# Patient Record
Sex: Female | Born: 1975 | Hispanic: No | Smoking: Current every day smoker
Health system: Southern US, Community
[De-identification: ages and names within clinical notes are randomized; demographics above are authoritative.]

## PROBLEM LIST (undated history)

## (undated) DIAGNOSIS — J302 Other seasonal allergic rhinitis: Secondary | ICD-10-CM

## (undated) DIAGNOSIS — S6291XA Unspecified fracture of right wrist and hand, initial encounter for closed fracture: Secondary | ICD-10-CM

## (undated) DIAGNOSIS — F419 Anxiety disorder, unspecified: Secondary | ICD-10-CM

## (undated) DIAGNOSIS — F32A Depression, unspecified: Secondary | ICD-10-CM

## (undated) DIAGNOSIS — Z973 Presence of spectacles and contact lenses: Secondary | ICD-10-CM

## (undated) DIAGNOSIS — K219 Gastro-esophageal reflux disease without esophagitis: Secondary | ICD-10-CM

## (undated) DIAGNOSIS — F329 Major depressive disorder, single episode, unspecified: Secondary | ICD-10-CM

---

## 1998-11-29 ENCOUNTER — Other Ambulatory Visit: Admission: RE | Admit: 1998-11-29 | Discharge: 1998-11-29 | Payer: Self-pay | Admitting: Obstetrics and Gynecology

## 2004-02-27 ENCOUNTER — Ambulatory Visit (HOSPITAL_COMMUNITY): Admission: RE | Admit: 2004-02-27 | Discharge: 2004-02-27 | Payer: Self-pay | Admitting: Obstetrics and Gynecology

## 2004-03-14 ENCOUNTER — Other Ambulatory Visit: Admission: RE | Admit: 2004-03-14 | Discharge: 2004-03-14 | Payer: Self-pay | Admitting: Obstetrics and Gynecology

## 2004-10-08 ENCOUNTER — Inpatient Hospital Stay (HOSPITAL_COMMUNITY): Admission: AD | Admit: 2004-10-08 | Discharge: 2004-10-10 | Payer: Self-pay | Admitting: Obstetrics and Gynecology

## 2005-08-07 IMAGING — US US OB COMP LESS 14 WK
1 series · 14 of 27 positions shown · non-contrast
Comparison: none

CLINICAL DATA: Irregular cycles.  Evaluate dating.

 OBSTETRICAL ULTRASOUND WITH TRANSVAGINAL:
 Number of Fetuses: 1
 Heart Rate:  172
 Amniotic fluid:  Normal
 CRL:    2.36 cm  9 w 0 d
 Fetal anatomy could not be evaluated due to the early gestational age.  Yolk sac and amnion visualized
 MATERNAL FINDINGS
 Cervix not evaluated.  Ovaries normal.

[Series 1: unknown · 0.28mm/px · 14 of 27 slices shown]
[im 1/27]
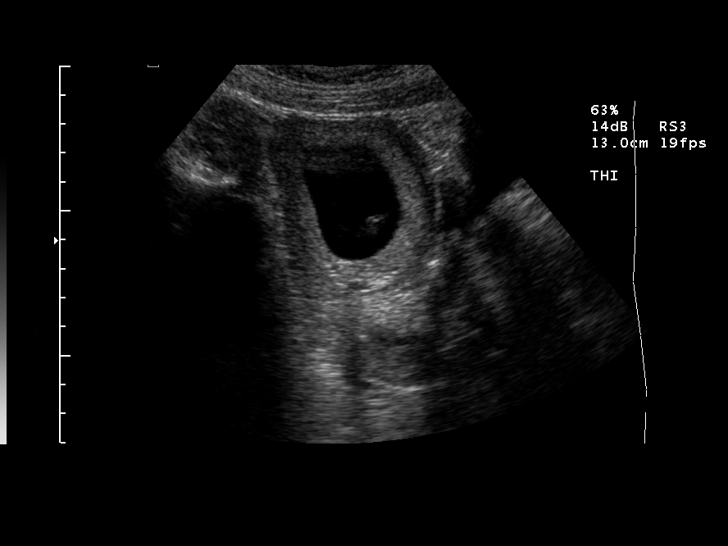
[im 3/27]
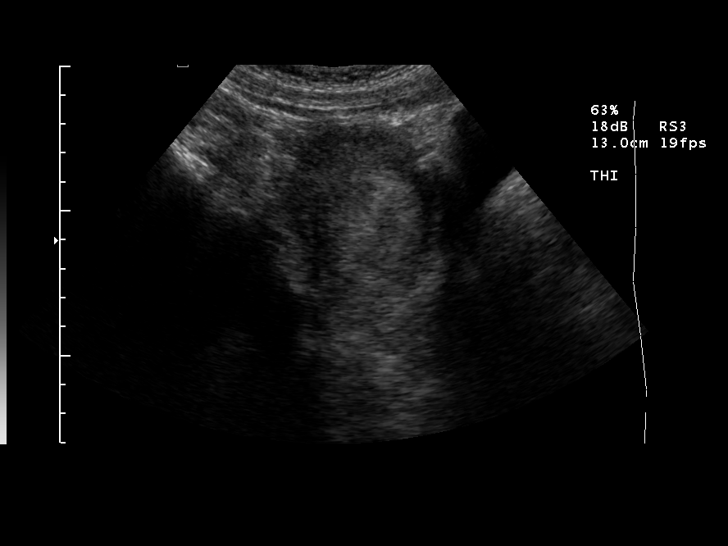
[im 5/27]
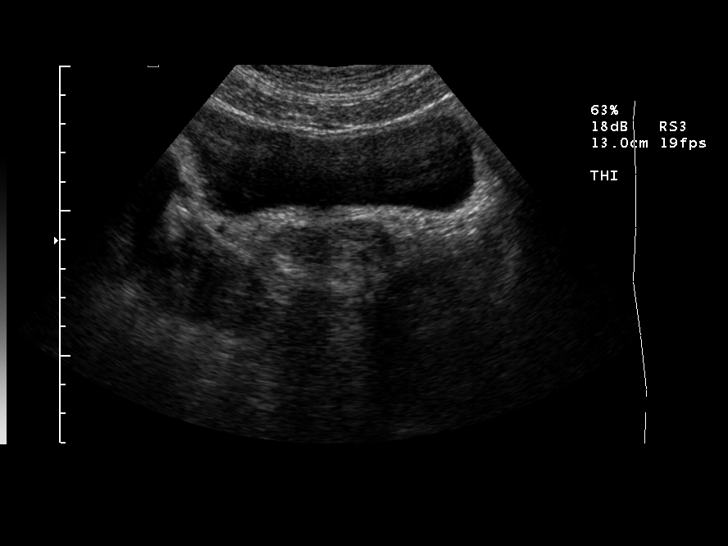
[im 7/27]
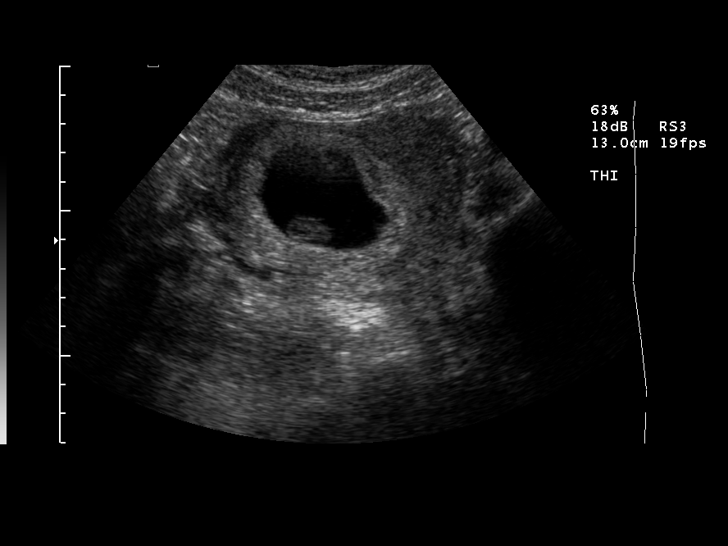
[im 9/27]
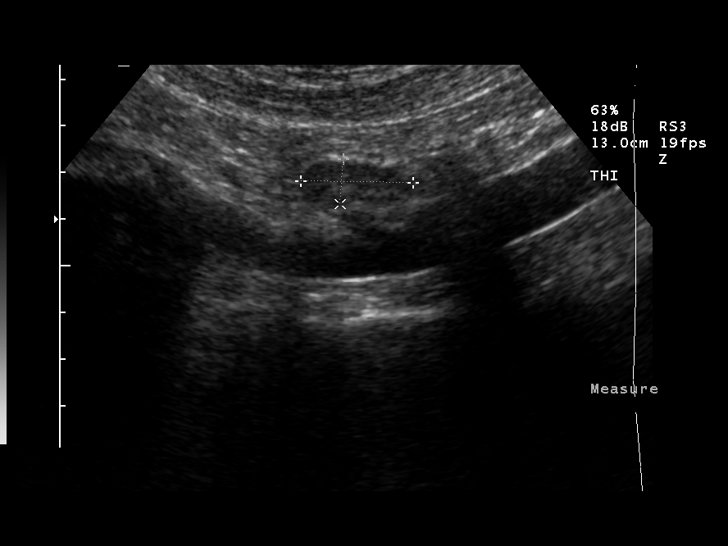
[im 11/27]
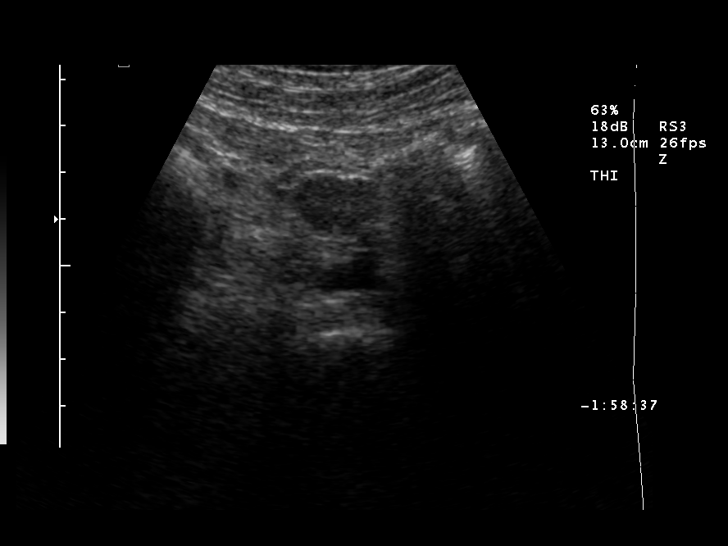
[im 13/27]
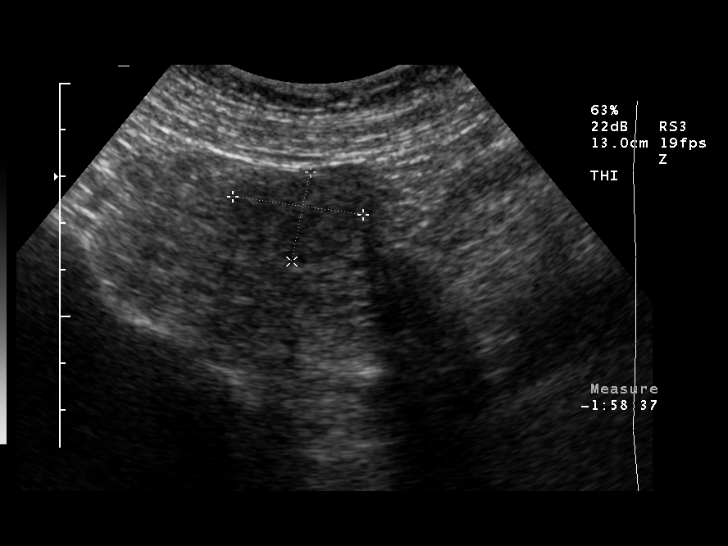
[im 15/27]
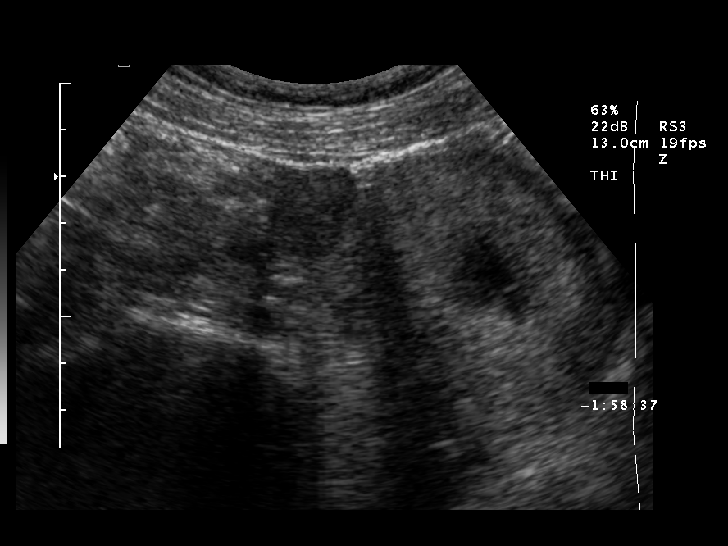
[im 17/27]
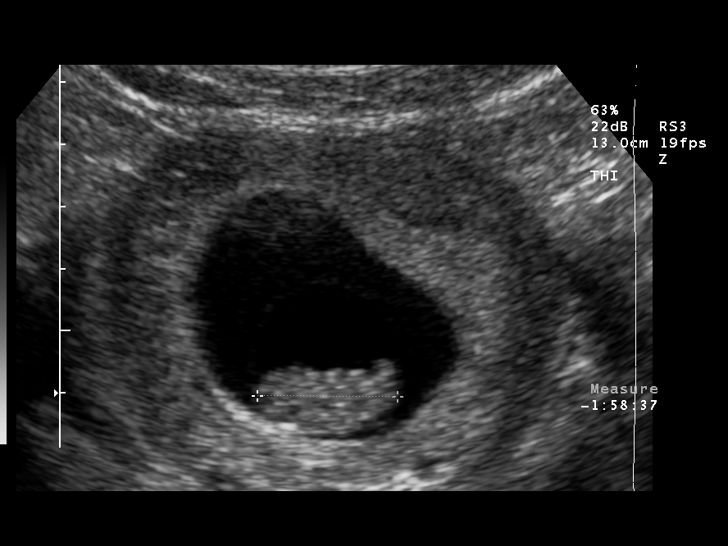
[im 19/27]
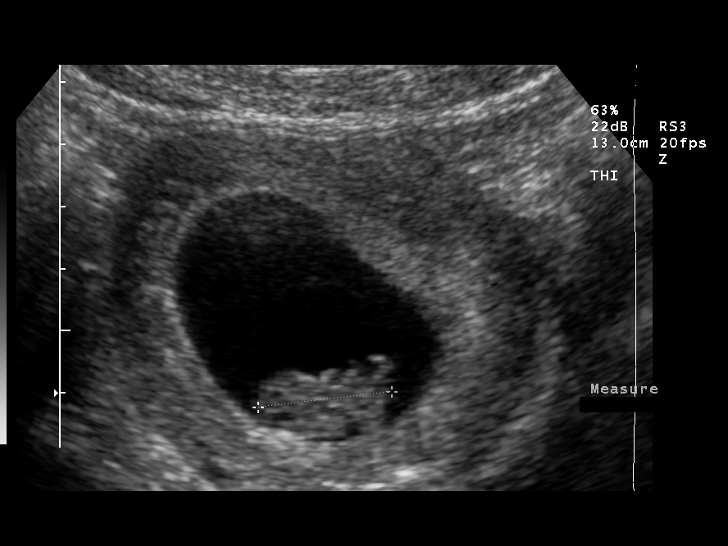
[im 21/27]
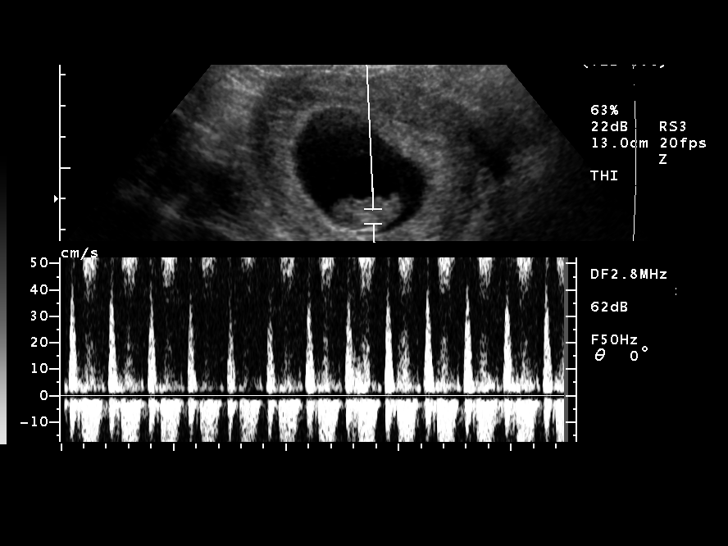
[im 23/27]
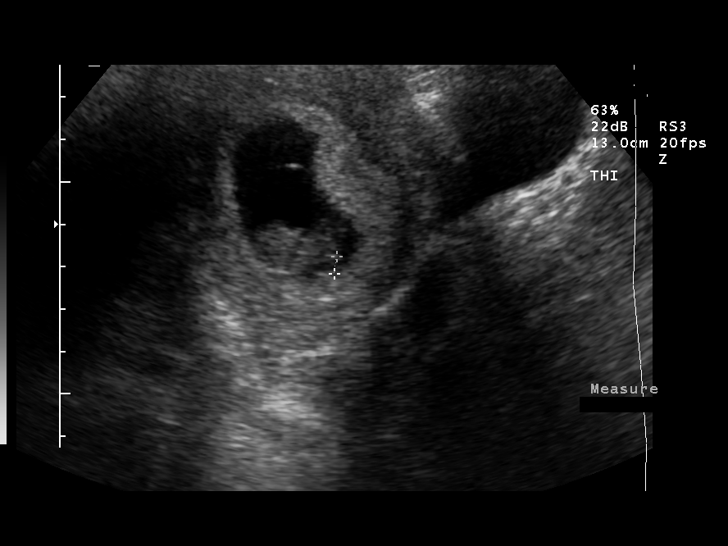
[im 25/27]
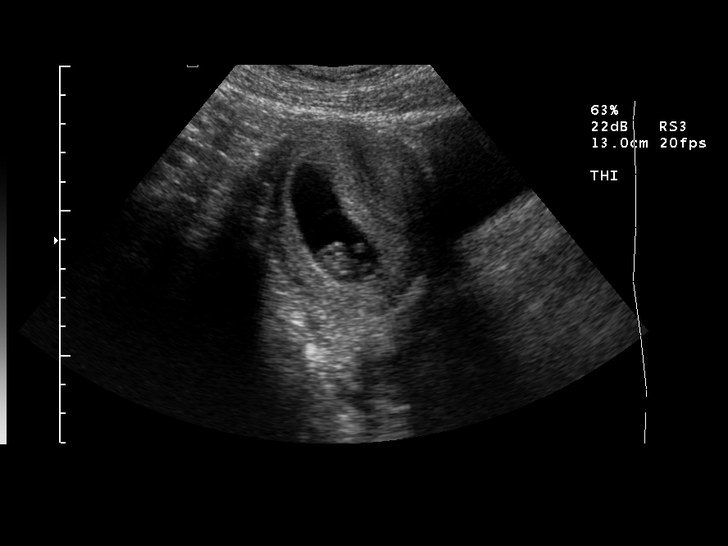
[im 27/27]
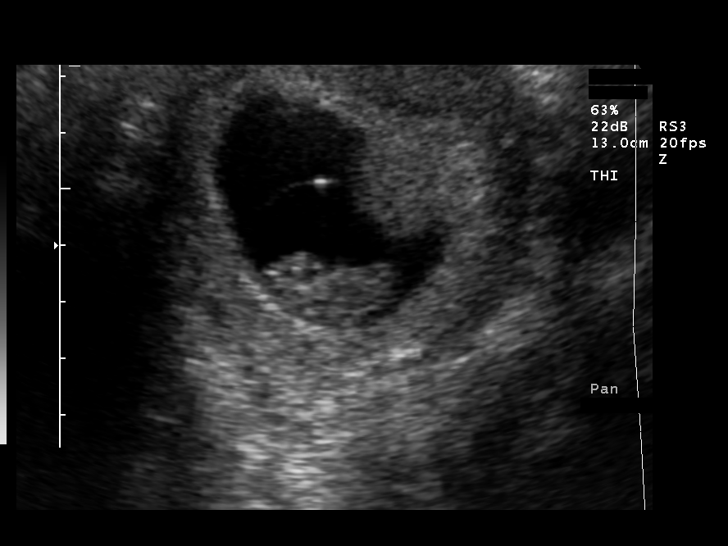

[14 of 27 positions shown; findings below may reference images not displayed]

IMPRESSION: Single living intrauterine gestation at 9 weeks 0 days by crown rump length. Yolk sac and amnion are both visualized.  No evidence for subchorionic hemorrhage.

## 2013-10-05 ENCOUNTER — Other Ambulatory Visit: Payer: Self-pay | Admitting: Family Medicine

## 2013-10-05 ENCOUNTER — Other Ambulatory Visit (HOSPITAL_COMMUNITY)
Admission: RE | Admit: 2013-10-05 | Discharge: 2013-10-05 | Disposition: A | Discharge status: Home / Self Care | Payer: BC Managed Care – PPO | Source: Ambulatory Visit | Attending: Family Medicine | Admitting: Family Medicine

## 2013-10-05 DIAGNOSIS — Z0000 Encounter for general adult medical examination without abnormal findings: Secondary | ICD-10-CM | POA: Insufficient documentation

## 2018-01-01 ENCOUNTER — Emergency Department (HOSPITAL_COMMUNITY)
Admission: EM | Admit: 2018-01-01 | Discharge: 2018-01-01 | Disposition: A | Discharge status: Home / Self Care | Payer: Self-pay | Attending: Emergency Medicine | Admitting: Emergency Medicine

## 2018-01-01 ENCOUNTER — Other Ambulatory Visit: Payer: Self-pay

## 2018-01-01 ENCOUNTER — Emergency Department (HOSPITAL_COMMUNITY): Payer: Self-pay

## 2018-01-01 ENCOUNTER — Encounter (HOSPITAL_COMMUNITY): Payer: Self-pay

## 2018-01-01 DIAGNOSIS — S62612A Displaced fracture of proximal phalanx of right middle finger, initial encounter for closed fracture: Secondary | ICD-10-CM | POA: Insufficient documentation

## 2018-01-01 DIAGNOSIS — F17210 Nicotine dependence, cigarettes, uncomplicated: Secondary | ICD-10-CM | POA: Insufficient documentation

## 2018-01-01 DIAGNOSIS — W1830XA Fall on same level, unspecified, initial encounter: Secondary | ICD-10-CM | POA: Insufficient documentation

## 2018-01-01 DIAGNOSIS — Z79899 Other long term (current) drug therapy: Secondary | ICD-10-CM | POA: Insufficient documentation

## 2018-01-01 DIAGNOSIS — Y999 Unspecified external cause status: Secondary | ICD-10-CM | POA: Insufficient documentation

## 2018-01-01 DIAGNOSIS — S62615A Displaced fracture of proximal phalanx of left ring finger, initial encounter for closed fracture: Secondary | ICD-10-CM | POA: Insufficient documentation

## 2018-01-01 DIAGNOSIS — S6291XA Unspecified fracture of right wrist and hand, initial encounter for closed fracture: Secondary | ICD-10-CM

## 2018-01-01 DIAGNOSIS — Y9301 Activity, walking, marching and hiking: Secondary | ICD-10-CM | POA: Insufficient documentation

## 2018-01-01 DIAGNOSIS — Z23 Encounter for immunization: Secondary | ICD-10-CM | POA: Insufficient documentation

## 2018-01-01 DIAGNOSIS — Y92014 Private driveway to single-family (private) house as the place of occurrence of the external cause: Secondary | ICD-10-CM | POA: Insufficient documentation

## 2018-01-01 MED ORDER — TETANUS-DIPHTH-ACELL PERTUSSIS 5-2.5-18.5 LF-MCG/0.5 IM SUSP
0.5000 mL | Freq: Once | INTRAMUSCULAR | Status: AC
  Administered 2018-01-01: 0.5 mL via INTRAMUSCULAR
  Filled 2018-01-01: qty 0.5

## 2018-01-01 MED ORDER — ACETAMINOPHEN 500 MG PO TABS
1000.0000 mg | ORAL_TABLET | Freq: Once | ORAL | Status: AC
  Administered 2018-01-01: 1000 mg via ORAL
  Filled 2018-01-01: qty 2

## 2018-01-01 MED ORDER — IBUPROFEN 800 MG PO TABS: 800.0000 mg | ORAL_TABLET | Freq: Three times a day (TID) | ORAL | 0 refills | Status: AC

## 2018-01-01 MED ORDER — ACETAMINOPHEN 500 MG PO TABS: 1000.0000 mg | ORAL_TABLET | Freq: Three times a day (TID) | ORAL | 0 refills | Status: AC | PRN

## 2018-01-01 NOTE — ED Provider Notes (Addendum)
[facility]-EMERGENCY DEPT Provider Note   CSN: [id number] Arrival date & time: 01/01/18  1049     History   Chief Complaint Chief Complaint  Patient presents with  . Hand Injury    HPI Destiny Higgins is a 42 y.o. female who is previously healthy and left-handed who presents with right hand pain, ecchymosis, and swelling after a fall last night.  Patient reports she tripped over some cracks in her driveway and had a fall on outstretched hand.  She reports she believes her fingers were dislocated after the fall and she impulsively pulled on them which straightened them out.  Patient has had some tingling to the end of her fingers, but no numbness.  She has used ice at home and taking Advil with good relief.  She denies any wrist pain or other injuries.  She has mild abrasion on her hand.  Her tetanus is not up-to-date.  HPI  History reviewed. No pertinent past medical history.  There are no active problems to display for this patient.   History reviewed. No pertinent surgical history.   OB History   None      Home Medications    Prior to Admission medications   Medication Sig Start Date End Date Taking? Authorizing Provider  fluticasone (FLONASE) 50 MCG/ACT nasal spray Place 1 spray into both nostrils daily.   Yes [provider]  ibuprofen (ADVIL,MOTRIN) 200 MG tablet Take 440 mg by mouth daily as needed.   Yes [provider]  Multiple Vitamin (MULTIVITAMIN WITH MINERALS) TABS tablet Take 1 tablet by mouth daily.   Yes [provider]  acetaminophen (TYLENOL) 500 MG tablet Take 2 tablets (1,000 mg total) by mouth every 8 (eight) hours as needed. 01/01/18   Tlaloc Taddei, Waylan Boga, PA-C  ibuprofen (ADVIL,MOTRIN) 800 MG tablet Take 1 tablet (800 mg total) by mouth 3 (three) times daily. 01/01/18   Emi Holes, PA-C    Family History Family History  Problem Relation Age of Onset  . Stroke Father   . Cancer Father     Social  History Social History   Tobacco Use  . Smoking status: Current Every Day Smoker    Packs/day: 0.50    Types: Cigarettes  . Smokeless tobacco: Never Used  Substance Use Topics  . Alcohol use: Yes    Comment: 3-4 times a week  . Drug use: Never     Allergies   Patient has no known allergies.   Review of Systems Review of Systems  Musculoskeletal: Positive for arthralgias.  Skin: Positive for color change and wound.  Neurological: Negative for numbness.     Physical Exam Updated Vital Signs BP (!) 147/90 (BP Location: Left Arm)   Pulse 73   Temp 98.1 F (36.7 C) (Oral)   Resp 18   Ht  (1.676 m)   Wt 65.8 kg (145 lb)   LMP 12/11/2017 (Approximate)   SpO2 100%   BMI 23.40 kg/m   Physical Exam  Constitutional: She appears well-developed and well-nourished. No distress.  HENT:  Head: Normocephalic and atraumatic.  Mouth/Throat: Oropharynx is clear and moist. No oropharyngeal exudate.  Eyes: Pupils are equal, round, and reactive to light. Conjunctivae are normal. Right eye exhibits no discharge. Left eye exhibits no discharge. No scleral icterus.  Neck: Normal range of motion. Neck supple. No thyromegaly present.  Cardiovascular: Normal rate, regular rhythm, normal heart sounds and intact distal pulses. Exam reveals no gallop and no friction rub.  No murmur heard. Pulmonary/Chest: Effort normal and breath sounds normal. No stridor. No respiratory distress. She has no wheezes. She has no rales.  Musculoskeletal: She exhibits no edema.  Significant edema and ecchymosis to the right hand, sensation intact to all digits, cap refill less than 2 seconds, tenderness at the MCP of the third and fourth digits, range of motion limited due to pain and swelling, however flexion and extension intact at the MCPs, DIPs, and PIPs of all digits; no wrist or anatomical snuffbox tenderness; very superficial abrasion over the right palmar aspect of the PIP  Lymphadenopathy:    She has  no cervical adenopathy.  Neurological: She is alert. Coordination normal.  Skin: Skin is warm and dry. No rash noted. She is not diaphoretic. No pallor.  Psychiatric: She has a normal mood and affect.  Nursing note and vitals reviewed.    ED Treatments / Results  Labs (all labs ordered are listed, but only abnormal results are displayed) Labs Reviewed - No data to display  EKG None  Radiology Dg Hand Complete Right  Result Date: 01/01/2018 CLINICAL DATA:  Hand injury last night. EXAM: RIGHT HAND - COMPLETE 3+ VIEW COMPARISON:  None. FINDINGS: The bones appear adequately mineralized. There are acute, comminuted and mildly displaced intra-articular fractures involving the bases of the 3rd and 4th proximal phalanges. Both fractures are mildly displaced with mild apex anterior angulation. There is no dislocation. No other acute osseous findings are seen. IMPRESSION: Intra-articular fractures involving the bases of the 3rd and 4th proximal phalanges. Electronically Signed   By: Carey Bullocks M.D.   On: 01/01/2018 11:29    Procedures Procedures (including critical care time)  SPLINT APPLICATION Date/Time: 2:55 PM Authorized by: Emi Holes Consent: Verbal consent obtained. Risks and benefits: risks, benefits and alternatives were discussed Consent given by: patient Splint applied by: orthopedic technician Location details: R wrist Splint type: Volar short arm Supplies used: Orthoglass, ACE wrap Post-procedure: The splinted body part was neurovascularly unchanged following the procedure. Patient tolerance: Patient tolerated the procedure well with no immediate complications.     Medications Ordered in ED Medications  Tdap (BOOSTRIX) injection 0.5 mL (0.5 mLs Intramuscular Given 01/01/18 1247)  acetaminophen (TYLENOL) tablet 1,000 mg (1,000 mg Oral Given 01/01/18 1241)     Initial Impression / Assessment and Plan / ED Course  I have reviewed the triage vital signs and the  nursing notes.  Pertinent labs & imaging results that were available during my care of the patient were reviewed by me and considered in my medical decision making (see chart for details).     Patient with intra-articular fractures involving the bases of the right third and fourth proximal phalanges.  There is mild displacement on x-ray.  Patient reports she reduced her fingers last night.  Discussed patient case with Dr. Ronie Spies nurse who advised splint and follow-up in the office next week.  Splint care and supportive care discussed.  Considering minor abrasions, tetanus updated in the ED.  Patient offered pain medication, however she is content with ibuprofen and Tylenol.  Return precautions discussed.  Patient understands and agrees with plan.  Patient vitals stable throughout ED course and discharged in satisfactory condition. I discussed patient case with Dr. Rhunette Croft who guided the patient's management and agrees with plan.   Final Clinical Impressions(s) / ED Diagnoses   Final diagnoses:  Closed fracture of right hand, initial encounter    ED Discharge Orders        Ordered  ibuprofen (ADVIL,MOTRIN) 800 MG tablet  3 times daily     01/01/18 1319    acetaminophen (TYLENOL) 500 MG tablet  Every 8 hours PRN     01/01/18 1319           Square Jowett, [place], PA-C 01/01/18 1456    Derwood Kaplan, MD 01/06/18 1805

## 2018-01-01 NOTE — ED Triage Notes (Signed)
Patient reports that she fell in her driveway last night, landing on her right hand. patient states she tripped on a crack in her driveway.

## 2018-01-01 NOTE — Discharge Instructions (Signed)
Do not get your splint wet.  You can alternate ibuprofen and Tylenol as prescribed for your pain.  Please follow-up with Dr. Mina Marble.  His office should call you today, however if they do not call you by the end of the day, please call for an appointment Monday or Tuesday.  Please return to the emergency department if you develop any new or worsening symptoms or if you get your splint wet.

## 2018-01-01 NOTE — ED Notes (Signed)
Bed: WTR6 Expected date:  Expected time:  Means of arrival:  Comments: 

## 2018-01-07 ENCOUNTER — Other Ambulatory Visit: Payer: Self-pay | Admitting: Orthopedic Surgery

## 2018-01-08 ENCOUNTER — Encounter (HOSPITAL_BASED_OUTPATIENT_CLINIC_OR_DEPARTMENT_OTHER): Payer: Self-pay

## 2018-01-11 ENCOUNTER — Encounter (HOSPITAL_BASED_OUTPATIENT_CLINIC_OR_DEPARTMENT_OTHER): Payer: Self-pay

## 2018-01-11 ENCOUNTER — Other Ambulatory Visit: Payer: Self-pay

## 2018-01-11 NOTE — Progress Notes (Signed)
Spoke with:  Jacki Cones NPO:  No food after midnight/Clear liquids until 7:00AM DOS, No smoking after midnight Arrival time:  1100AM Labs:  UPT AM medications:  None Pre op orders:  Yes Ride home:  [place] (friend) [phone]

## 2018-01-13 ENCOUNTER — Ambulatory Visit (HOSPITAL_BASED_OUTPATIENT_CLINIC_OR_DEPARTMENT_OTHER): Payer: Self-pay | Admitting: Anesthesiology

## 2018-01-13 ENCOUNTER — Encounter (HOSPITAL_BASED_OUTPATIENT_CLINIC_OR_DEPARTMENT_OTHER)
Admission: RE | Disposition: A | Discharge status: Home / Self Care | Payer: Self-pay | Source: Ambulatory Visit | Attending: Orthopedic Surgery

## 2018-01-13 ENCOUNTER — Ambulatory Visit (HOSPITAL_BASED_OUTPATIENT_CLINIC_OR_DEPARTMENT_OTHER)
Admission: RE | Admit: 2018-01-13 | Discharge: 2018-01-13 | Disposition: A | Discharge status: Home / Self Care | Payer: Self-pay | Source: Ambulatory Visit | Attending: Orthopedic Surgery | Admitting: Orthopedic Surgery

## 2018-01-13 ENCOUNTER — Encounter (HOSPITAL_BASED_OUTPATIENT_CLINIC_OR_DEPARTMENT_OTHER): Payer: Self-pay | Admitting: Anesthesiology

## 2018-01-13 ENCOUNTER — Other Ambulatory Visit: Payer: Self-pay

## 2018-01-13 DIAGNOSIS — F419 Anxiety disorder, unspecified: Secondary | ICD-10-CM | POA: Insufficient documentation

## 2018-01-13 DIAGNOSIS — S62612A Displaced fracture of proximal phalanx of right middle finger, initial encounter for closed fracture: Secondary | ICD-10-CM | POA: Insufficient documentation

## 2018-01-13 DIAGNOSIS — F17210 Nicotine dependence, cigarettes, uncomplicated: Secondary | ICD-10-CM | POA: Insufficient documentation

## 2018-01-13 DIAGNOSIS — K219 Gastro-esophageal reflux disease without esophagitis: Secondary | ICD-10-CM | POA: Insufficient documentation

## 2018-01-13 DIAGNOSIS — S62614A Displaced fracture of proximal phalanx of right ring finger, initial encounter for closed fracture: Secondary | ICD-10-CM | POA: Insufficient documentation

## 2018-01-13 DIAGNOSIS — Z79899 Other long term (current) drug therapy: Secondary | ICD-10-CM | POA: Insufficient documentation

## 2018-01-13 DIAGNOSIS — F329 Major depressive disorder, single episode, unspecified: Secondary | ICD-10-CM | POA: Insufficient documentation

## 2018-01-13 DIAGNOSIS — Y92008 Other place in unspecified non-institutional (private) residence as the place of occurrence of the external cause: Secondary | ICD-10-CM | POA: Insufficient documentation

## 2018-01-13 DIAGNOSIS — W010XXA Fall on same level from slipping, tripping and stumbling without subsequent striking against object, initial encounter: Secondary | ICD-10-CM | POA: Insufficient documentation

## 2018-01-13 HISTORY — DX: Major depressive disorder, single episode, unspecified: F32.9

## 2018-01-13 HISTORY — PX: CLOSED REDUCTION METACARPAL WITH PERCUTANEOUS PINNING: SHX5613

## 2018-01-13 HISTORY — DX: Depression, unspecified: F32.A

## 2018-01-13 HISTORY — DX: Anxiety disorder, unspecified: F41.9

## 2018-01-13 HISTORY — DX: Presence of spectacles and contact lenses: Z97.3

## 2018-01-13 HISTORY — DX: Unspecified fracture of right wrist and hand, initial encounter for closed fracture: S62.91XA

## 2018-01-13 HISTORY — DX: Gastro-esophageal reflux disease without esophagitis: K21.9

## 2018-01-13 HISTORY — DX: Other seasonal allergic rhinitis: J30.2

## 2018-01-13 LAB — POCT PREGNANCY, URINE: Preg Test, Ur: NEGATIVE

## 2018-01-13 SURGERY — CLOSED REDUCTION, FRACTURE, METACARPAL BONE, WITH PERCUTANEOUS PINNING
Anesthesia: General | Site: Finger | Laterality: Right

## 2018-01-13 MED ORDER — BUPIVACAINE HCL (PF) 0.25 % IJ SOLN
INTRAMUSCULAR | Status: DC | PRN
  Administered 2018-01-13: 8 mL

## 2018-01-13 MED ORDER — OXYCODONE HCL 5 MG/5ML PO SOLN
5.0000 mg | Freq: Once | ORAL | Status: DC | PRN
  Filled 2018-01-13: qty 5

## 2018-01-13 MED ORDER — FENTANYL CITRATE (PF) 100 MCG/2ML IJ SOLN
25.0000 ug | INTRAMUSCULAR | Status: DC | PRN
  Administered 2018-01-13 (×4): 25 ug via INTRAVENOUS
  Filled 2018-01-13: qty 1

## 2018-01-13 MED ORDER — LACTATED RINGERS IV SOLN
INTRAVENOUS | Status: DC
  Administered 2018-01-13 (×2): via INTRAVENOUS
  Filled 2018-01-13: qty 1000

## 2018-01-13 MED ORDER — CEFAZOLIN SODIUM-DEXTROSE 2-4 GM/100ML-% IV SOLN
2.0000 g | INTRAVENOUS | Status: AC
  Administered 2018-01-13: 2 g via INTRAVENOUS
  Filled 2018-01-13: qty 100

## 2018-01-13 MED ORDER — MIDAZOLAM HCL 2 MG/2ML IJ SOLN
INTRAMUSCULAR | Status: DC | PRN
  Administered 2018-01-13: 2 mg via INTRAVENOUS

## 2018-01-13 MED ORDER — FENTANYL CITRATE (PF) 100 MCG/2ML IJ SOLN
INTRAMUSCULAR | Status: DC | PRN
  Administered 2018-01-13 (×2): 50 ug via INTRAVENOUS

## 2018-01-13 MED ORDER — ONDANSETRON HCL 4 MG/2ML IJ SOLN
INTRAMUSCULAR | Status: AC
  Filled 2018-01-13: qty 2

## 2018-01-13 MED ORDER — FENTANYL CITRATE (PF) 100 MCG/2ML IJ SOLN
INTRAMUSCULAR | Status: AC
  Filled 2018-01-13: qty 2

## 2018-01-13 MED ORDER — ONDANSETRON HCL 4 MG/2ML IJ SOLN
4.0000 mg | Freq: Four times a day (QID) | INTRAMUSCULAR | Status: DC | PRN
  Filled 2018-01-13: qty 2

## 2018-01-13 MED ORDER — CHLORHEXIDINE GLUCONATE 4 % EX LIQD
60.0000 mL | Freq: Once | CUTANEOUS | Status: DC
  Filled 2018-01-13: qty 118

## 2018-01-13 MED ORDER — DEXAMETHASONE SODIUM PHOSPHATE 10 MG/ML IJ SOLN
INTRAMUSCULAR | Status: DC | PRN
  Administered 2018-01-13: 10 mg via INTRAVENOUS

## 2018-01-13 MED ORDER — MIDAZOLAM HCL 2 MG/2ML IJ SOLN
INTRAMUSCULAR | Status: AC
  Filled 2018-01-13: qty 2

## 2018-01-13 MED ORDER — OXYCODONE-ACETAMINOPHEN 5-325 MG PO TABS
ORAL_TABLET | ORAL | Status: AC
  Filled 2018-01-13: qty 1

## 2018-01-13 MED ORDER — DEXAMETHASONE SODIUM PHOSPHATE 10 MG/ML IJ SOLN
INTRAMUSCULAR | Status: AC
  Filled 2018-01-13: qty 1

## 2018-01-13 MED ORDER — LIDOCAINE 2% (20 MG/ML) 5 ML SYRINGE
INTRAMUSCULAR | Status: AC
  Filled 2018-01-13: qty 5

## 2018-01-13 MED ORDER — OXYCODONE-ACETAMINOPHEN 5-325 MG PO TABS
1.0000 | ORAL_TABLET | Freq: Four times a day (QID) | ORAL | Status: DC | PRN
Start: 2018-01-13 — End: 2018-01-13
  Administered 2018-01-13: 1 via ORAL
  Filled 2018-01-13: qty 1

## 2018-01-13 MED ORDER — CEFAZOLIN SODIUM-DEXTROSE 2-4 GM/100ML-% IV SOLN
INTRAVENOUS | Status: AC
  Filled 2018-01-13: qty 100

## 2018-01-13 MED ORDER — KETOROLAC TROMETHAMINE 30 MG/ML IJ SOLN
INTRAMUSCULAR | Status: AC
  Filled 2018-01-13: qty 1

## 2018-01-13 MED ORDER — ONDANSETRON HCL 4 MG/2ML IJ SOLN
INTRAMUSCULAR | Status: DC | PRN
  Administered 2018-01-13: 4 mg via INTRAVENOUS

## 2018-01-13 MED ORDER — PROPOFOL 10 MG/ML IV BOLUS
INTRAVENOUS | Status: AC
  Filled 2018-01-13: qty 40

## 2018-01-13 MED ORDER — PROPOFOL 10 MG/ML IV BOLUS
INTRAVENOUS | Status: DC | PRN
  Administered 2018-01-13: 200 mg via INTRAVENOUS

## 2018-01-13 MED ORDER — OXYCODONE HCL 5 MG PO TABS
5.0000 mg | ORAL_TABLET | Freq: Once | ORAL | Status: DC | PRN
  Filled 2018-01-13: qty 1

## 2018-01-13 MED ORDER — LIDOCAINE 2% (20 MG/ML) 5 ML SYRINGE
INTRAMUSCULAR | Status: DC | PRN
  Administered 2018-01-13: 60 mg via INTRAVENOUS

## 2018-01-13 SURGICAL SUPPLY — 65 items
APL SKNCLS STERI-STRIP NONHPOA (GAUZE/BANDAGES/DRESSINGS)
BANDAGE ACE 3X5.8 VEL STRL LF (GAUZE/BANDAGES/DRESSINGS) ×3 IMPLANT
BANDAGE ACE 4X5 VEL STRL LF (GAUZE/BANDAGES/DRESSINGS) IMPLANT
BENZOIN TINCTURE PRP APPL 2/3 (GAUZE/BANDAGES/DRESSINGS) IMPLANT
BLADE SURG 15 STRL LF DISP TIS (BLADE) ×1 IMPLANT
BLADE SURG 15 STRL SS (BLADE) ×3
BNDG CMPR 9X4 STRL LF SNTH (GAUZE/BANDAGES/DRESSINGS)
BNDG ELASTIC 2X5.8 VLCR STR LF (GAUZE/BANDAGES/DRESSINGS) IMPLANT
BNDG ESMARK 4X9 LF (GAUZE/BANDAGES/DRESSINGS) IMPLANT
BNDG GAUZE ELAST 4 BULKY (GAUZE/BANDAGES/DRESSINGS) ×2 IMPLANT
CANISTER SUCT 1200ML W/VALVE (MISCELLANEOUS) IMPLANT
CLOSURE WOUND 1/2 X4 (GAUZE/BANDAGES/DRESSINGS)
CORD BIPOLAR FORCEPS 12FT (ELECTRODE) ×2 IMPLANT
COVER BACK TABLE 60X90IN (DRAPES) ×3 IMPLANT
CUFF TOURNIQUET SINGLE 18IN (TOURNIQUET CUFF) IMPLANT
DECANTER SPIKE VIAL GLASS SM (MISCELLANEOUS) IMPLANT
DRAPE EXTREMITY T 121X128X90 (DRAPE) ×3 IMPLANT
DRAPE LG THREE QUARTER DISP (DRAPES) ×1 IMPLANT
DRAPE OEC MINIVIEW 54X84 (DRAPES) ×3 IMPLANT
DRAPE SURG 17X23 STRL (DRAPES) ×3 IMPLANT
DURAPREP 26ML APPLICATOR (WOUND CARE) ×3 IMPLANT
GAUZE SPONGE 4X4 12PLY STRL (GAUZE/BANDAGES/DRESSINGS) ×3 IMPLANT
GAUZE SPONGE 4X4 16PLY XRAY LF (GAUZE/BANDAGES/DRESSINGS) IMPLANT
GAUZE SPONGE 4X4 8PLY STR LF (GAUZE/BANDAGES/DRESSINGS) ×2 IMPLANT
GAUZE XEROFORM 1X8 LF (GAUZE/BANDAGES/DRESSINGS) ×2 IMPLANT
GLOVE BIOGEL M STRL SZ7.5 (GLOVE) ×3 IMPLANT
GLOVE SURG SYN 8.0 (GLOVE) ×6 IMPLANT
GLOVE SURG SYN 8.0 PF PI (GLOVE) ×2 IMPLANT
GOWN STRL REUS W/ TWL LRG LVL3 (GOWN DISPOSABLE) ×1 IMPLANT
GOWN STRL REUS W/TWL LRG LVL3 (GOWN DISPOSABLE) ×3
GOWN STRL REUS W/TWL XL LVL3 (GOWN DISPOSABLE) ×6 IMPLANT
NDL HYPO 25X1 1.5 SAFETY (NEEDLE) IMPLANT
NEEDLE HYPO 25X1 1.5 SAFETY (NEEDLE) ×3 IMPLANT
NS IRRIG 1000ML POUR BTL (IV SOLUTION) IMPLANT
PACK BASIN DAY SURGERY FS (CUSTOM PROCEDURE TRAY) ×3 IMPLANT
PAD CAST 3X4 CTTN HI CHSV (CAST SUPPLIES) ×1 IMPLANT
PAD CAST 4YDX4 CTTN HI CHSV (CAST SUPPLIES) IMPLANT
PADDING CAST ABS 4INX4YD NS (CAST SUPPLIES) ×2
PADDING CAST ABS COTTON 4X4 ST (CAST SUPPLIES) ×1 IMPLANT
PADDING CAST COTTON 3X4 STRL (CAST SUPPLIES) ×3
PADDING CAST COTTON 4X4 STRL (CAST SUPPLIES)
PADDING UNDERCAST 2 STRL (CAST SUPPLIES) ×2
PADDING UNDERCAST 2X4 STRL (CAST SUPPLIES) ×1 IMPLANT
SPLINT PLASTER CAST XFAST 3X15 (CAST SUPPLIES) IMPLANT
SPLINT PLASTER CAST XFAST 4X15 (CAST SUPPLIES) IMPLANT
SPLINT PLASTER XTRA FAST SET 4 (CAST SUPPLIES)
SPLINT PLASTER XTRA FASTSET 3X (CAST SUPPLIES) ×2
STOCKINETTE 4X48 STRL (DRAPES) ×3 IMPLANT
STRIP CLOSURE SKIN 1/2X4 (GAUZE/BANDAGES/DRESSINGS) IMPLANT
SUCTION FRAZIER HANDLE 10FR (MISCELLANEOUS)
SUCTION TUBE FRAZIER 10FR DISP (MISCELLANEOUS) IMPLANT
SUT ETHILON 4 0 PS 2 18 (SUTURE) IMPLANT
SUT ETHILON 5 0 PS 2 18 (SUTURE) IMPLANT
SUT MERSILENE 4 0 P 3 (SUTURE) IMPLANT
SUT VIC AB 4-0 P-3 18XBRD (SUTURE) IMPLANT
SUT VIC AB 4-0 P3 18 (SUTURE)
SUT VICRYL RAPIDE 4-0 (SUTURE) IMPLANT
SUT VICRYL RAPIDE 4/0 PS 2 (SUTURE) IMPLANT
SYR 10ML LL (SYRINGE) IMPLANT
SYR BULB 3OZ (MISCELLANEOUS) IMPLANT
TOWEL OR 17X24 6PK STRL BLUE (TOWEL DISPOSABLE) ×3 IMPLANT
TUBE CONNECTING 20'X1/4 (TUBING)
TUBE CONNECTING 20X1/4 (TUBING) IMPLANT
UNDERPAD 30X30 (UNDERPADS AND DIAPERS) ×3 IMPLANT
k-wire 0.45 non thread ×6 IMPLANT

## 2018-01-13 NOTE — Anesthesia Preprocedure Evaluation (Addendum)
Anesthesia Evaluation  Patient identified by MRN, date of birth, ID band Patient awake    Reviewed: Allergy & Precautions, H&P , NPO status , Patient's Chart, lab work & pertinent test results  Airway Mallampati: II  TM Distance: >3 FB Neck ROM: full    Dental  (+) Teeth Intact, Dental Advisory Given   Pulmonary Current Smoker,    breath sounds clear to auscultation       Cardiovascular negative cardio ROS   Rhythm:regular Rate:Normal     Neuro/Psych PSYCHIATRIC DISORDERS Anxiety Depression    GI/Hepatic GERD  Medicated and Controlled,  Endo/Other    Renal/GU negative Renal ROS     Musculoskeletal   Abdominal   Peds  Hematology negative hematology ROS (+)   Anesthesia Other Findings   Reproductive/Obstetrics                           Anesthesia Physical Anesthesia Plan  ASA: II  Anesthesia Plan: General   Post-op Pain Management:    Induction: Intravenous  PONV Risk Score and Plan: 2 and Ondansetron, Dexamethasone, Midazolam and Treatment may vary due to age or medical condition  Airway Management Planned: LMA  Additional Equipment:   Intra-op Plan:   Post-operative Plan:   Informed Consent: I have reviewed the patients History and Physical, chart, labs and discussed the procedure including the risks, benefits and alternatives for the proposed anesthesia with the patient or authorized representative who has indicated his/her understanding and acceptance.     Plan Discussed with: CRNA, Anesthesiologist and Surgeon  Anesthesia Plan Comments:         Anesthesia Quick Evaluation

## 2018-01-13 NOTE — H&P (Signed)
Destiny Higgins is an 42 y.o. female.   Chief Complaint: Right long and ring finger pain, swelling, and deformity status post trauma HPI: Patient is a very pleasant 42 year old right-hand-dominant female status post right hand trauma with displaced fractures of the long and ring proximal phalanges at the bases with apex volar angulation  Past Medical History:  Diagnosis Date  . Anxiety   . Depression   . GERD (gastroesophageal reflux disease)   . Right hand fracture   . Seasonal allergies   . Wears contact lenses   . Wears glasses     History reviewed. No pertinent surgical history.  Family History  Problem Relation Age of Onset  . Stroke Father   . Cancer Father    Social History:  reports that she has been smoking cigarettes.  She has a 10.00 pack-year smoking history. She has never used smokeless tobacco. She reports that she drinks alcohol. She reports that she does not use drugs.  Allergies: No Known Allergies  Medications Prior to Admission  Medication Sig Dispense Refill  . acetaminophen (TYLENOL) 500 MG tablet Take 2 tablets (1,000 mg total) by mouth every 8 (eight) hours as needed. (Patient taking differently: Take 1,000 mg by mouth every 8 (eight) hours as needed for mild pain. ) 30 tablet 0  . fluticasone (FLONASE) 50 MCG/ACT nasal spray Place 1 spray into both nostrils daily.    Marland Kitchen ibuprofen (ADVIL,MOTRIN) 800 MG tablet Take 1 tablet (800 mg total) by mouth 3 (three) times daily. 21 tablet 0  . Multiple Vitamin (MULTIVITAMIN WITH MINERALS) TABS tablet Take 1 tablet by mouth daily.    Marland Kitchen OVER THE COUNTER MEDICATION Green Tea/White tea      Results for orders placed or performed during the hospital encounter of 01/13/18 (from the past 48 hour(s))  Pregnancy, urine POC     Status: None   Collection Time: 01/13/18 11:21 AM  Result Value Ref Range   Preg Test, Ur NEGATIVE NEGATIVE    Comment:        THE SENSITIVITY OF THIS METHODOLOGY IS >24 mIU/mL    No results  found.  Review of Systems  All other systems reviewed and are negative.   Blood pressure (!) 130/100, pulse 79, temperature 98.4 F (36.9 C), temperature source Oral, resp. rate 16, height  (1.676 m), weight 66 kg (145 lb 9.6 oz), last menstrual period 01/07/2018, SpO2 100 %. Physical Exam  Constitutional: She is oriented to person, place, and time. She appears well-developed and well-nourished.  HENT:  Head: Normocephalic and atraumatic.  Neck: Normal range of motion.  Cardiovascular: Normal rate.  Respiratory: Effort normal.  Musculoskeletal:       Right hand: She exhibits tenderness, bony tenderness, deformity and swelling.  Right hand with pain, swelling, and deformity to long and ring fingers status post hand trauma  Neurological: She is alert and oriented to person, place, and time.  Skin: Skin is warm.  Psychiatric: She has a normal mood and affect. Her behavior is normal. Judgment and thought content normal.     Assessment/Plan 42 year old female with displaced fractures her dominant right long and ring proximal phalanges.  We have discussed in great detail the nature of her current problem and treatment options.  We recommend close reduction and percutaneous pinning of these fractures as an outpatient.  Patient understands the risks and benefits and wishes to proceed.  Marlowe Shores, MD 01/13/2018, 12:22 PM

## 2018-01-13 NOTE — Discharge Instructions (Signed)

## 2018-01-13 NOTE — Op Note (Signed)
Please see dictated report #[id number]

## 2018-01-13 NOTE — Op Note (Signed)
NAMEDORIANN, ZUCH MEDICAL RECORD [id number] ACCOUNT [id number] DATE OF BIRTH:[birth year] FACILITY: [facility] LOCATION: [facility] PHYSICIAN:Arlana Canizales A. Mina Marble, MD  OPERATIVE REPORT  DATE OF PROCEDURE:  01/13/2018  PREOPERATIVE DIAGNOSIS:  Displaced right long and right ring proximal phalangeal fractures.  POSTOPERATIVE DIAGNOSIS:  Displaced right long and right ring proximal phalangeal fractures.  PROCEDURE PERFORMED:  Closed reduction and percutaneous pinning of these fractures with 0.045 K-wires x2 for each digit.   SURGEON:  Dairl Ponder, MD  ASSISTANT:  None.  ANESTHESIA:  General.  COMPLICATIONS:  None.  DRAINS:  None.   DESCRIPTION OF PROCEDURE:  The patient was taken to the operating suite after the induction of adequate general anesthetic, right upper extremity was prepped and draped in the usual sterile fashion.  An Esmarch was used to exsanguinate the limb and the  tourniquet was inflated to 250 mmHg.  At this point in time, longitudinal traction was applied to the right long and ring proximal phalangeal bases.  Fluoroscopic imaging was used to maintain reduction and at that point in time, 0.045 K wires x2, one for  the radial and one for the ulnar side of each digit, was placed percutaneously from proximal to distal under direct and fluoroscopic guidance.  Imaging revealed reduction of the fracture and good placement of the hardware in multiple views.  The K-wires  were cut outside the skin and bent upon themselves.  They were dressed with Xeroform, 4 x 4's, and a dorsal block splint.  The patient tolerated this procedure well and went to recovery room in stable condition.  GN/NUANCE  D:01/13/2018 T:01/13/2018 JOB:[id number]

## 2018-01-13 NOTE — Anesthesia Procedure Notes (Signed)
Procedure Name: LMA Insertion Date/Time: 01/13/2018 12:55 PM Performed by: Tyrone Nine, CRNA Pre-anesthesia Checklist: Patient identified, Emergency Drugs available, Suction available, Patient being monitored and Timeout performed Patient Re-evaluated:Patient Re-evaluated prior to induction Oxygen Delivery Method: Circle system utilized Preoxygenation: Pre-oxygenation with 100% oxygen Induction Type: IV induction Ventilation: Mask ventilation without difficulty LMA: LMA inserted LMA Size: 4.0 Number of attempts: 1 Placement Confirmation: breath sounds checked- equal and bilateral,  CO2 detector and positive ETCO2 Tube secured with: Tape Dental Injury: Teeth and Oropharynx as per pre-operative assessment

## 2018-01-14 ENCOUNTER — Encounter (HOSPITAL_BASED_OUTPATIENT_CLINIC_OR_DEPARTMENT_OTHER): Payer: Self-pay | Admitting: Orthopedic Surgery

## 2018-01-14 NOTE — Anesthesia Postprocedure Evaluation (Signed)
Anesthesia Post Note  Patient: Makynlie Rossini  Procedure(s) Performed: CLOSED REDUCTION METACARPAL WITH PERCUTANEOUS PINNING RIGHT LONG, RIGHT RING, P-1 FRACTURES (Right Finger)     Patient location during evaluation: PACU Anesthesia Type: General Level of consciousness: awake and alert Pain management: pain level controlled Vital Signs Assessment: post-procedure vital signs reviewed and stable Respiratory status: spontaneous breathing, nonlabored ventilation, respiratory function stable and patient connected to nasal cannula oxygen Cardiovascular status: blood pressure returned to baseline and stable Postop Assessment: no apparent nausea or vomiting Anesthetic complications: no    Last Vitals:  Vitals:   01/13/18 1415 01/13/18 1518  BP: (!) 144/97 (!) 141/96  Pulse: 67 70  Resp: 15 18  Temp:  36.4 C  SpO2: 99% 100%    Last Pain:  Vitals:   01/13/18 1518  TempSrc: Oral  PainSc:                  Rhiann Boucher S

## 2018-01-18 ENCOUNTER — Encounter (HOSPITAL_BASED_OUTPATIENT_CLINIC_OR_DEPARTMENT_OTHER): Payer: Self-pay | Admitting: Orthopedic Surgery

## 2018-01-18 NOTE — Transfer of Care (Addendum)
Immediate Anesthesia Transfer of Care Note  Patient: Destiny Higgins  Procedure(s) Performed: CLOSED REDUCTION METACARPAL WITH PERCUTANEOUS PINNING RIGHT LONG, RIGHT RING, P-1 FRACTURES (Right Finger)  Patient Location: PACU  Anesthesia Type:General  Level of Consciousness: awake, alert , oriented and patient cooperative  Airway & Oxygen Therapy: Patient Spontanous Breathing and Patient connected to nasal cannula oxygen  Post-op Assessment: Report given to RN and Post -op Vital signs reviewed and stable  Post vital signs: Reviewed and stable  Last Vitals:  Vitals Value Taken Time  BP    Temp    Pulse    Resp    SpO2      Last Pain:  Vitals:   01/14/18 1416  TempSrc:   PainSc: 4       Patients Stated Pain Goal: 4 (01/13/18 1207)  Complications: No apparent anesthesia complications

## 2019-02-09 ENCOUNTER — Other Ambulatory Visit: Payer: Self-pay | Admitting: Pediatric Intensive Care

## 2019-02-09 DIAGNOSIS — Z20822 Contact with and (suspected) exposure to covid-19: Secondary | ICD-10-CM

## 2019-02-10 LAB — NOVEL CORONAVIRUS, NAA: SARS-CoV-2, NAA: NOT DETECTED

## 2019-05-16 ENCOUNTER — Other Ambulatory Visit: Payer: Self-pay

## 2019-05-16 DIAGNOSIS — Z20822 Contact with and (suspected) exposure to covid-19: Secondary | ICD-10-CM

## 2019-05-17 LAB — NOVEL CORONAVIRUS, NAA: SARS-CoV-2, NAA: NOT DETECTED

## 2019-08-31 ENCOUNTER — Ambulatory Visit: Payer: Medicaid Other | Attending: Internal Medicine

## 2019-08-31 DIAGNOSIS — Z20822 Contact with and (suspected) exposure to covid-19: Secondary | ICD-10-CM

## 2019-09-01 LAB — NOVEL CORONAVIRUS, NAA: SARS-CoV-2, NAA: NOT DETECTED

## 2019-11-23 ENCOUNTER — Ambulatory Visit: Payer: Medicaid Other | Attending: Internal Medicine
